# Patient Record
Sex: Male | Born: 1975 | Hispanic: No | Marital: Single | Smoking: Current every day smoker
Health system: Southern US, Community
[De-identification: ages and names within clinical notes are randomized; demographics above are authoritative.]

---

## 2018-03-27 ENCOUNTER — Other Ambulatory Visit: Payer: Self-pay

## 2018-03-27 DIAGNOSIS — R1030 Lower abdominal pain, unspecified: Secondary | ICD-10-CM | POA: Insufficient documentation

## 2018-03-27 DIAGNOSIS — F17200 Nicotine dependence, unspecified, uncomplicated: Secondary | ICD-10-CM | POA: Insufficient documentation

## 2018-03-27 DIAGNOSIS — S3130XD Unspecified open wound of scrotum and testes, subsequent encounter: Secondary | ICD-10-CM | POA: Insufficient documentation

## 2018-03-27 DIAGNOSIS — X58XXXA Exposure to other specified factors, initial encounter: Secondary | ICD-10-CM | POA: Insufficient documentation

## 2018-03-27 LAB — COMPREHENSIVE METABOLIC PANEL
ALBUMIN: 4.3 g/dL (ref 3.5–5.0)
ALK PHOS: 49 U/L (ref 38–126)
ALT: 41 U/L (ref 0–44)
AST: 32 U/L (ref 15–41)
Anion gap: 8 (ref 5–15)
BUN: 12 mg/dL (ref 6–20)
CALCIUM: 9.4 mg/dL (ref 8.9–10.3)
CO2: 25 mmol/L (ref 22–32)
CREATININE: 1.09 mg/dL (ref 0.61–1.24)
Chloride: 107 mmol/L (ref 98–111)
GFR calc Af Amer: >60 mL/min (ref >60)
GFR calc non Af Amer: >60 mL/min (ref >60)
GLUCOSE: 86 mg/dL (ref 70–99)
Potassium: 4 mmol/L (ref 3.5–5.1)
SODIUM: 140 mmol/L (ref 135–145)
Total Bilirubin: 1.1 mg/dL (ref 0.3–1.2)
Total Protein: 7.6 g/dL (ref 6.5–8.1)

## 2018-03-27 LAB — CBC
HCT: 44.2 % (ref 40.0–52.0)
HEMOGLOBIN: 15.3 g/dL (ref 13.0–18.0)
MCH: 32.8 pg (ref 26.0–34.0)
MCHC: 34.7 g/dL (ref 32.0–36.0)
MCV: 94.5 fL (ref 80.0–100.0)
Platelets: 179 10*3/uL (ref 150–440)
RBC: 4.67 MIL/uL (ref 4.40–5.90)
RDW: 12.8 % (ref 11.5–14.5)
WBC: 11 10*3/uL — ABNORMAL HIGH (ref 3.8–10.6)

## 2018-03-27 NOTE — ED Triage Notes (Signed)
Pt was trimming perianal area before vacation last week. States since then has had increased pain and swelling to area since. Went to [place] last Wednesday where they drained it and put him on antibiotics. States area has worsened and has been having nausea.

## 2018-03-28 ENCOUNTER — Emergency Department: Payer: Self-pay

## 2018-03-28 ENCOUNTER — Emergency Department
Admission: EM | Admit: 2018-03-28 | Discharge: 2018-03-28 | Disposition: A | Discharge status: Home / Self Care | Payer: Self-pay | Attending: Emergency Medicine | Admitting: Emergency Medicine

## 2018-03-28 ENCOUNTER — Encounter: Payer: Self-pay | Admitting: Emergency Medicine

## 2018-03-28 DIAGNOSIS — Z09 Encounter for follow-up examination after completed treatment for conditions other than malignant neoplasm: Secondary | ICD-10-CM

## 2018-03-28 MED ORDER — ONDANSETRON HCL 4 MG/2ML IJ SOLN
INTRAMUSCULAR | Status: AC
  Administered 2018-03-28: 4 mg
  Filled 2018-03-28: qty 2

## 2018-03-28 MED ORDER — MORPHINE SULFATE (PF) 4 MG/ML IV SOLN
INTRAVENOUS | Status: AC
  Administered 2018-03-28: 4 mg
  Filled 2018-03-28: qty 1

## 2018-03-28 MED ORDER — IOHEXOL 300 MG/ML  SOLN
100.0000 mL | Freq: Once | INTRAMUSCULAR | Status: AC | PRN
  Administered 2018-03-28: 100 mL via INTRAVENOUS

## 2018-03-28 NOTE — ED Notes (Signed)
Pt returned from CT °

## 2018-03-28 NOTE — ED Provider Notes (Signed)
[facility] Emergency Department Provider Note  ____________________________________________   First MD Initiated Contact with Patient 03/28/18 [phone]     (approximate)  I have reviewed the triage vital signs and the nursing notes.   HISTORY  Chief Complaint Abscess    HPI Joseph Shelton is a 42 y.o. male with no contributory past medical history who presents for follow-up regarding an abscess on his perineum.  He reports that a little over week ago he was trimming his hair and nicked himself in the area behind his scrotum.  Over a few days it developed into a large very painful swollen area consistent with abscess.  He went to the [facility] emergency department about 5 or 6 days ago and had the abscess drained.  The packing came out the next day and even though the area is smaller he continues to have severe pain made worse with ambulation or certain sitting positions.  After starting the antibiotics (Bactrim 1 tablet twice a day) he also started having some abdominal discomfort, nausea, and vomiting.  He is concerned that the abscess has not fully drained or is worsening.  He denies fever/chills but reports general malaise.  He denies chest pain and shortness of breath.  No upper abdominal pain, the pain is mostly down in the pelvis and perineum.  There is no significant swelling, no dysuria.  He has less appetite but is able to eat and drink.  History reviewed. No pertinent past medical history.  There are no active problems to display for this patient.   History reviewed. No pertinent surgical history.  Prior to Admission medications   Not on File    Allergies Patient has no known allergies.  History reviewed. No pertinent family history.  Social History Social History   Tobacco Use  . Smoking status: Current Every Day Smoker  . Smokeless tobacco: Never Used  Substance Use Topics  . Alcohol use: Not on file  . Drug use: Not on file     Review of Systems Constitutional: No fever/chills.  General malaise. Eyes: No visual changes. ENT: No sore throat. Cardiovascular: Denies chest pain. Respiratory: Denies shortness of breath. Gastrointestinal: No abdominal pain.  Some nausea and vomiting which he feels may be due to the antibiotics.  No diarrhea.  No constipation. Genitourinary: Pain in the perineum after incision and drainage about 5 or 6 days ago.  Negative for dysuria. Musculoskeletal: Negative for neck pain.  Negative for back pain. Integumentary: Negative for rash. Neurological: Negative for headaches, focal weakness or numbness.   ____________________________________________   PHYSICAL EXAM:  VITAL SIGNS: ED Triage Vitals  Enc Vitals Group     BP 03/27/18 2218 136/90     Pulse Rate 03/27/18 2218 100     Resp 03/27/18 2218 18     Temp 03/27/18 2218 98.6 F (37 C)     Temp Source 03/27/18 2218 Oral     SpO2 03/27/18 2218 98 %     Weight 03/27/18 2220 80.3 kg (177 lb)     Height 03/27/18 2220 1.727 m (5\' 8" )     Head Circumference --      Peak Flow --      Pain Score 03/27/18 2226 10     Pain Loc --      Pain Edu? --      Excl. in GC? --     Constitutional: Alert and oriented. Well appearing and in no acute distress. Eyes: Conjunctivae are normal.  Head:  Atraumatic. Nose: No congestion/rhinnorhea. Mouth/Throat: Mucous membranes are moist. Neck: No stridor.  No meningeal signs.   Cardiovascular: Normal rate, regular rhythm. Good peripheral circulation. Grossly normal heart sounds. Respiratory: Normal respiratory effort.  No retractions. Lungs CTAB. Gastrointestinal: Soft and nontender. No distention.  Genitourinary: There is a small incision in the perineum consistent with the recent I&D history.  There is no significant fluctuance nor induration of the area.  There is no crepitus concerning for Fournier's gangrene.  There is severely tender to palpation.  No edema, no evidence of  cellulitis. Musculoskeletal: No lower extremity tenderness nor edema. No gross deformities of extremities. Neurologic:  Normal speech and language. No gross focal neurologic deficits are appreciated.  Skin:  Skin is warm, dry and intact. No rash noted. Psychiatric: Mood and affect are normal. Speech and behavior are normal.  ____________________________________________   LABS (all labs ordered are listed, but only abnormal results are displayed)  Labs Reviewed  CBC - Abnormal; Notable for the following components:      Result Value   WBC 11.0 (*)    All other components within normal limits  COMPREHENSIVE METABOLIC PANEL   ____________________________________________  EKG  None - EKG not ordered by ED physician ____________________________________________  RADIOLOGY   ED MD interpretation:  No evidence of acute infection nor abscess  Official radiology report(s): Ct Pelvis W Contrast  Result Date: 03/28/2018 CLINICAL DATA:  Acute onset of perineal pain and swelling. Assess for abscess. EXAM: CT PELVIS WITH CONTRAST TECHNIQUE: Multidetector CT imaging of the pelvis was performed using the standard protocol following the bolus administration of intravenous contrast. CONTRAST:  100 mL of Omnipaque 300 IV contrast COMPARISON:  None. FINDINGS: Urinary Tract: The bladder is moderately distended and grossly unremarkable. Bowel: Visualized small and large bowel loops are grossly unremarkable in appearance. The appendix is grossly unremarkable. Vascular/Lymphatic: The visualized vasculature is unremarkable in appearance. No retroperitoneal or pelvic sidewall lymphadenopathy is seen. Reproductive:  The prostate remains normal in size. Other: The perineal soft tissues are grossly unremarkable in appearance. No abnormal soft tissue inflammation is seen. There is no evidence of abscess. The anorectal canal is unremarkable in appearance. The scrotum is unremarkable. Musculoskeletal: No acute  osseous abnormalities are identified. The visualized musculature is unremarkable in appearance. IMPRESSION: Unremarkable contrast-enhanced CT of the pelvis. Perineal soft tissues are unremarkable in appearance. No evidence of abscess. Electronically Signed   By: Roanna RaiderJeffery  Chang M.D.   On: 03/28/2018 03:01    ____________________________________________   PROCEDURES  Critical Care performed: No   Procedure(s) performed:   Procedures   ____________________________________________   INITIAL IMPRESSION / ASSESSMENT AND PLAN / ED COURSE  As part of my medical decision making, I reviewed the following data within the electronic MEDICAL RECORD NUMBER Nursing notes reviewed and incorporated, Labs reviewed , Old chart reviewed and Notes from prior ED visits    Differential diagnosis includes, but is not limited to, persistent pain from I&D, worsening perianal or perirectal or perineal abscess, Fournier's gangrene, intra-abdominal infection or abscess.  The patient is generally well-appearing and is quite animated in his description of his symptoms and in the telling of the history.  Vital signs are normal except for a borderline tachycardia which could be secondary to pain.  He is afebrile and has appropriate blood pressure.  He has a white blood cell count of only 11 which is reassuring and a normal conference of metabolic panel.  On physical exam there is no evidence of Fournier's gangrene in  the exam is actually quite reassuring with a well-appearing incision, no active drainage, and no fluctuance nor induration.  However because of his persistent and in some ways worsening symptoms, I agreed to obtain a CT pelvis with IV contrast to look for any evidence of deep infection or abscess that would require surgical intervention.  He  agrees with the plan.  I have given morphine 4 mg IV and Zofran 4 mg IV.   Clinical Course as of Mar 28 324  Thu Mar 28, 2018  0306 Reassuring CT pelvis with no evidence  of abscess and not even any significant amount of inflammation of the tissues.  I will provide the reassurance to the patient.  Because he may be having some side effects to the Bactrim, I will switch him to doxycycline which should have sufficient coverage including MRSA.  I will also give him the option of coming off antibiotics completely since the incision and drainage has done its job and there is not even any evidence of cellulitis or soft tissue infection on the CT scan, but I will leave it up to him since there is not a strong medical indication either way but he may feel strongly about it.  CT PELVIS W CONTRAST [CF]  [phone] Patient agrees that it would be better if he did not take the antibiotics and I agree that there is no indication for it at this time.  I gave strict return precautions, however, if he is to develop new or worsening symptoms.  He agrees with the plan.   [CF]    Clinical Course User Index [CF] Loleta Rose, MD    ____________________________________________  FINAL CLINICAL IMPRESSION(S) / ED DIAGNOSES  Final diagnoses:  Encounter for recheck of abscess following incision and drainage     MEDICATIONS GIVEN DURING THIS VISIT:  Medications  ondansetron (ZOFRAN) 4 MG/2ML injection (4 mg  Given 03/28/18 0229)  morphine 4 MG/ML injection (4 mg  Given 03/28/18 0230)  iohexol (OMNIPAQUE) 300 MG/ML solution 100 mL (100 mLs Intravenous Contrast Given 03/28/18 0259)     ED Discharge Orders    None       Note:  This document was prepared using Dragon voice recognition software and may include unintentional dictation errors.    Loleta Rose, MD 03/28/18 [phone]

## 2018-03-28 NOTE — Discharge Instructions (Signed)
Fortunately your wound is well-appearing today and the CT scan showed no sign of tissue infection or abscess.  Your lab work was all reassuring as well.  Please continue to keep the area clean and dry and you may soak in warm baths to keep everything clean and to help draw out any additional infection or fluid that may be able to drain through the incision.  Please use over-the-counter ibuprofen 1000 mg up to 4 times a day as needed for pain in addition to ibuprofen 600 mg 3 times a day with meals as needed for pain.  As we discussed, since some of your symptoms may be caused by side effects to the antibiotics you are on, and since there is no evidence of any active infection at this time, we do not feel it is necessary for you to continue on antibiotics.  If you develop any new or worsening symptoms that concern you, including but not limited to fever, persistent vomiting, worsening pain or swelling, etc., please return to the nearest emergency department.

## 2019-08-08 IMAGING — CT CT PELVIS W/ CM
2 of 3 series · 17 of 46 positions shown, 19 images · IV contrast (omnipaque)
Comparison: None.

CLINICAL DATA: Acute onset of perineal pain and swelling. Assess
for abscess..

EXAM:
CT PELVIS WITH CONTRAST
TECHNIQUE: Multidetector CT imaging of the pelvis was performed using the
standard protocol following the bolus administration of intravenous
contrast.
CONTRAST:  100 mL of Omnipaque 300 IV contrast

[Series 2: routine abd/pel with · axial · 0.76mm/px · z∈[+1042,+1322]mm · 14 of 66 slices shown, 16 images]
[im 5/66  soft-tissue]
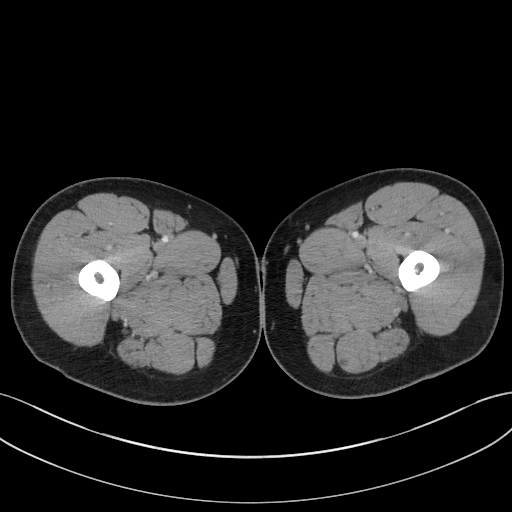
[im 5/66  bone]
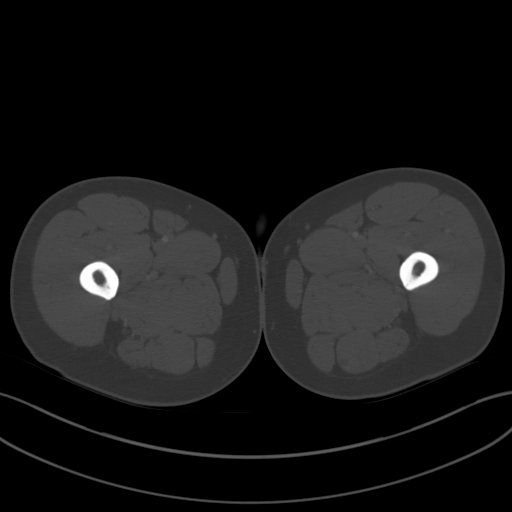
[im 9/66  soft-tissue]
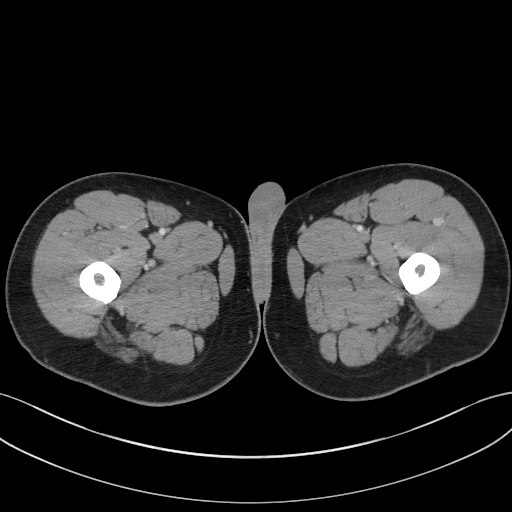
[im 13/66  soft-tissue]
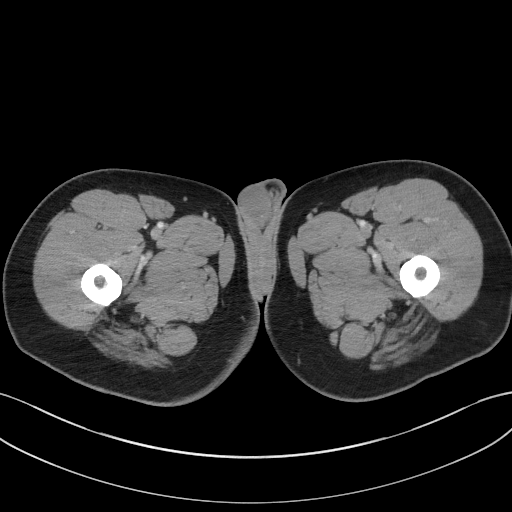
[im 17/66  soft-tissue]
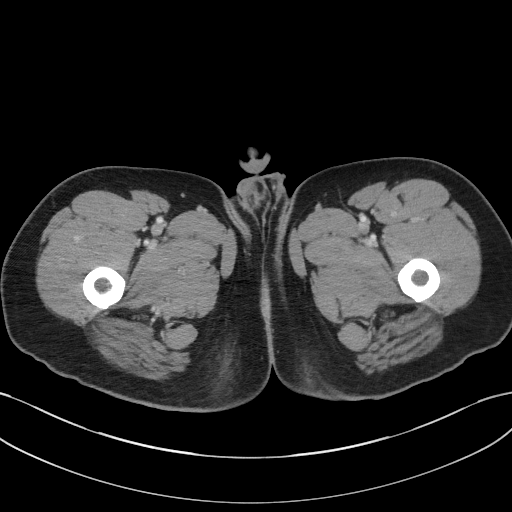
[im 21/66  soft-tissue]
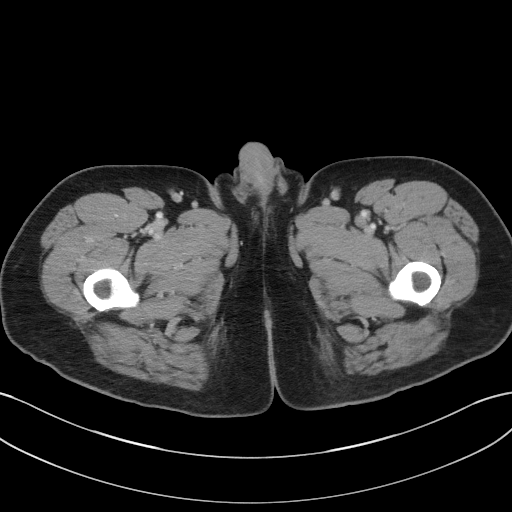
[im 26/66  soft-tissue]
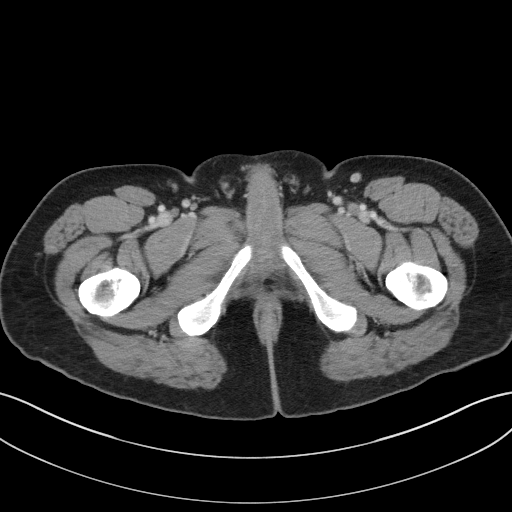
[im 30/66  soft-tissue]
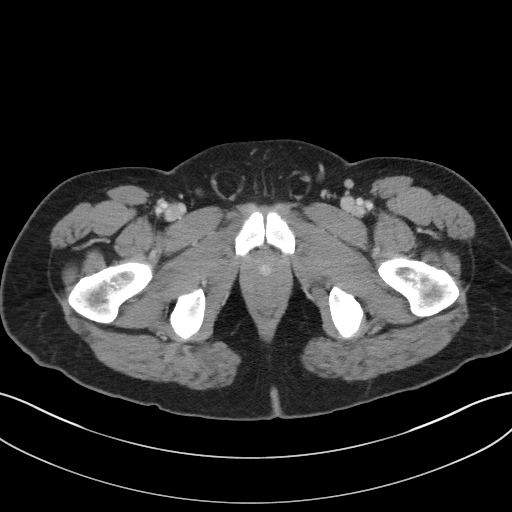
[im 36/66  soft-tissue]
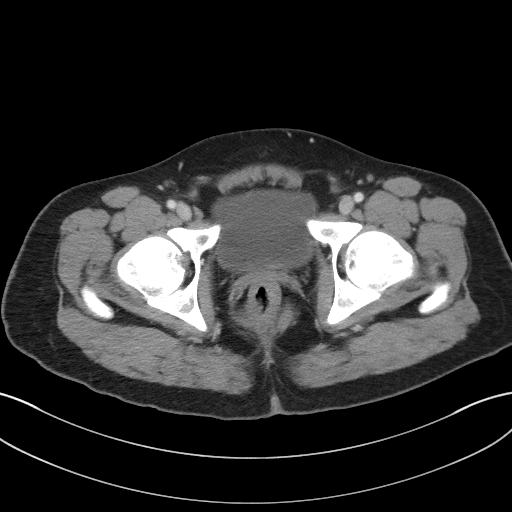
[im 40/66  soft-tissue]
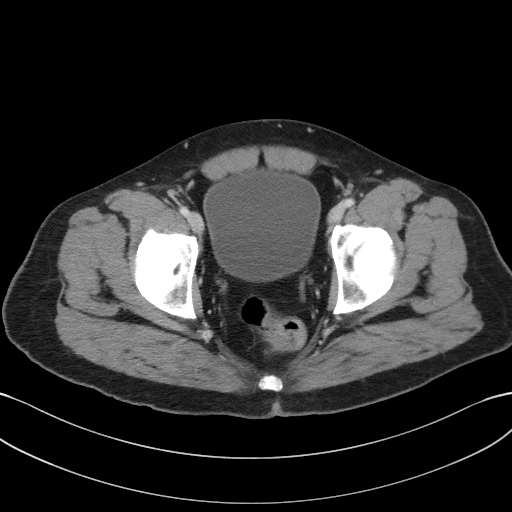
[im 40/66  bone]
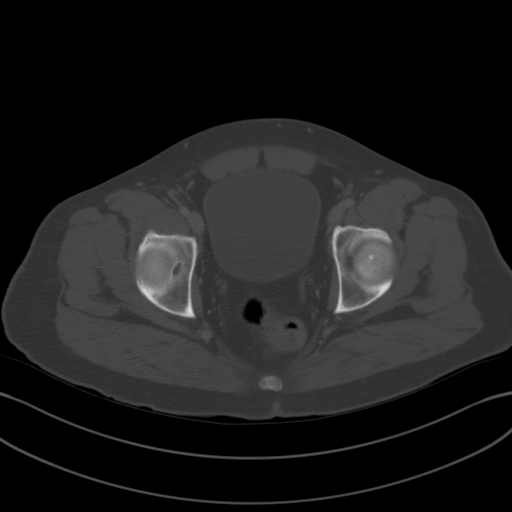
[im 45/66  soft-tissue]
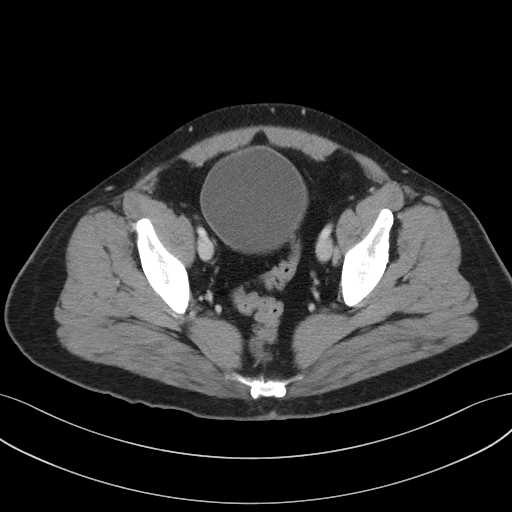
[im 49/66  soft-tissue]
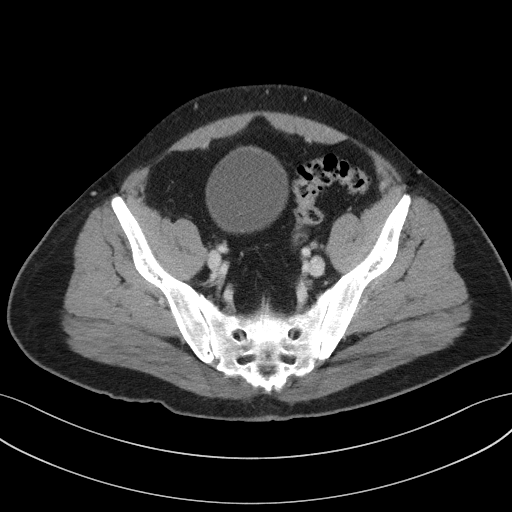
[im 53/66  soft-tissue]
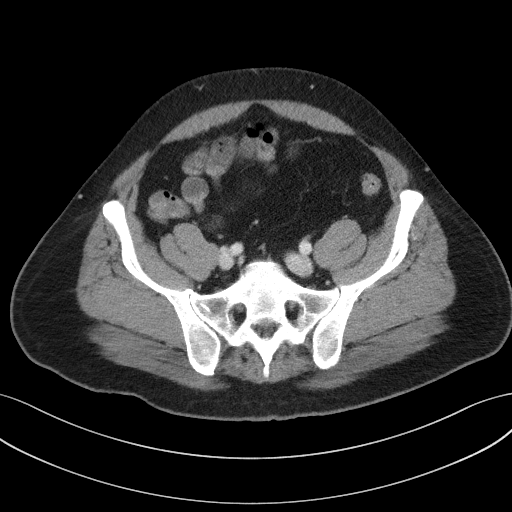
[im 57/66  soft-tissue]
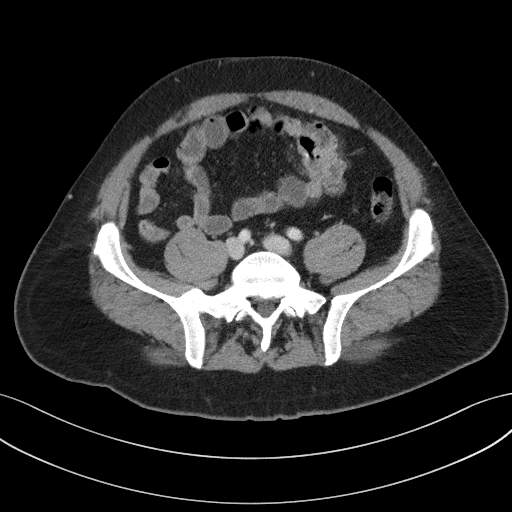
[im 61/66  soft-tissue]
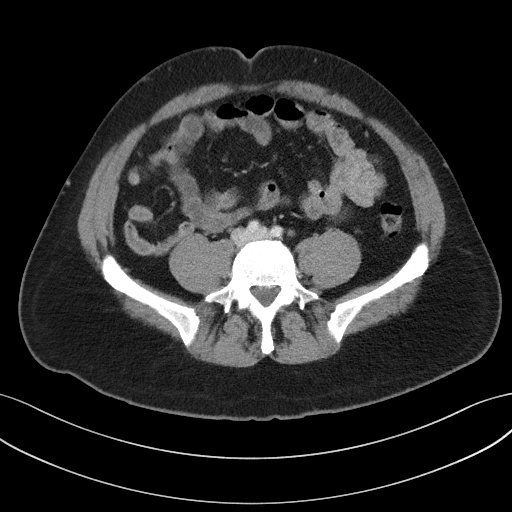

[Series 4: coronal st · coronal · 0.64mm/px · 3 of 94 slices shown]
[im 32/94  soft-tissue]
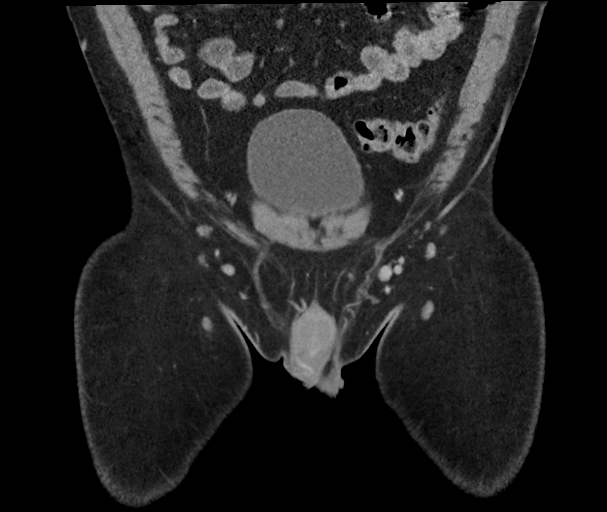
[im 42/94  soft-tissue]
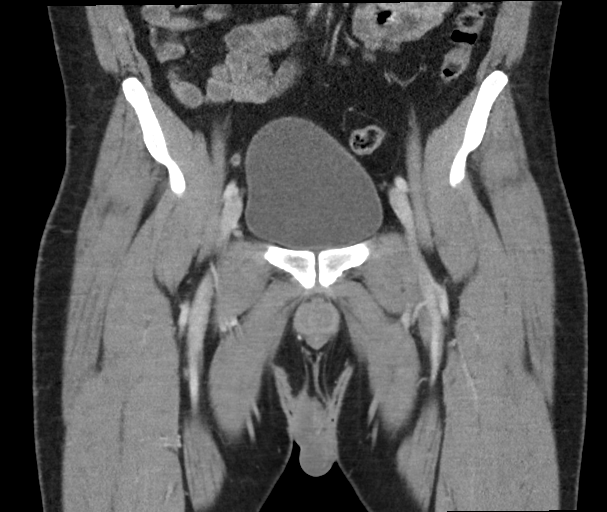
[im 52/94  soft-tissue]
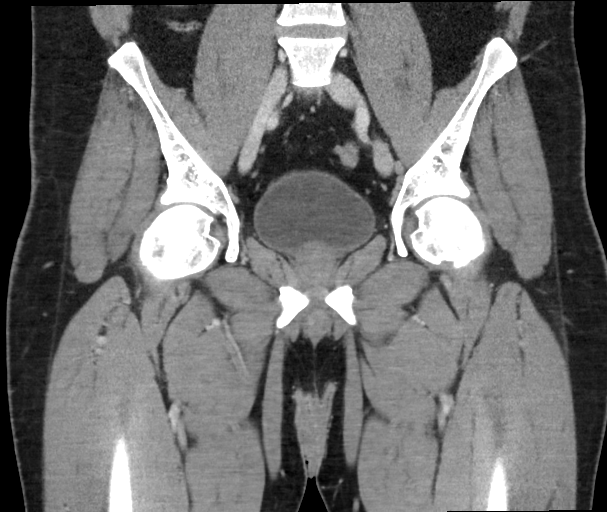

[17 of 46 positions shown; findings below may reference images not displayed]

FINDINGS: Urinary Tract: The bladder is moderately distended and grossly
unremarkable.

Bowel: Visualized small and large bowel loops are grossly
unremarkable in appearance. The appendix is grossly unremarkable.

Vascular/Lymphatic: The visualized vasculature is unremarkable in
appearance. No retroperitoneal or pelvic sidewall lymphadenopathy is
seen.

Reproductive:  The prostate remains normal in size.

Other: The perineal soft tissues are grossly unremarkable in
appearance. No abnormal soft tissue inflammation is seen. There is
no evidence of abscess. The anorectal canal is unremarkable in
appearance. The scrotum is unremarkable.

Musculoskeletal: No acute osseous abnormalities are identified. The
visualized musculature is unremarkable in appearance.
IMPRESSION: Unremarkable contrast-enhanced CT of the pelvis. Perineal soft
tissues are unremarkable in appearance. No evidence of abscess.
